# Patient Record
Sex: Female | Born: 1955 | Race: Black or African American | Hispanic: No | Smoking: Never smoker
Health system: Southern US, Community
[De-identification: ages and names within clinical notes are randomized; demographics above are authoritative.]

## PROBLEM LIST (undated history)

## (undated) DIAGNOSIS — I10 Essential (primary) hypertension: Secondary | ICD-10-CM

## (undated) HISTORY — DX: Essential (primary) hypertension: I10

---

## 1998-01-28 ENCOUNTER — Other Ambulatory Visit: Admission: RE | Admit: 1998-01-28 | Discharge: 1998-01-28 | Payer: Self-pay | Admitting: Obstetrics and Gynecology

## 1998-06-17 ENCOUNTER — Inpatient Hospital Stay (HOSPITAL_COMMUNITY): Admission: AD | Admit: 1998-06-17 | Discharge: 1998-06-17 | Payer: Self-pay | Admitting: Obstetrics and Gynecology

## 1998-08-31 ENCOUNTER — Inpatient Hospital Stay (HOSPITAL_COMMUNITY): Admission: AD | Admit: 1998-08-31 | Discharge: 1998-09-03 | Payer: Self-pay | Admitting: Obstetrics & Gynecology

## 1998-09-06 ENCOUNTER — Observation Stay (HOSPITAL_COMMUNITY): Admission: AD | Admit: 1998-09-06 | Discharge: 1998-09-07 | Payer: Self-pay | Admitting: Obstetrics & Gynecology

## 1998-10-03 ENCOUNTER — Other Ambulatory Visit: Admission: RE | Admit: 1998-10-03 | Discharge: 1998-10-03 | Payer: Self-pay | Admitting: Obstetrics & Gynecology

## 2000-02-24 ENCOUNTER — Other Ambulatory Visit: Admission: RE | Admit: 2000-02-24 | Discharge: 2000-02-24 | Payer: Self-pay | Admitting: Obstetrics & Gynecology

## 2001-07-18 ENCOUNTER — Other Ambulatory Visit: Admission: RE | Admit: 2001-07-18 | Discharge: 2001-07-18 | Payer: Self-pay | Admitting: Obstetrics & Gynecology

## 2003-01-15 ENCOUNTER — Other Ambulatory Visit: Admission: RE | Admit: 2003-01-15 | Discharge: 2003-01-15 | Payer: Self-pay | Admitting: Obstetrics and Gynecology

## 2003-11-04 ENCOUNTER — Emergency Department (HOSPITAL_COMMUNITY): Admission: AD | Admit: 2003-11-04 | Discharge: 2003-11-04 | Payer: Self-pay | Admitting: Family Medicine

## 2004-01-09 ENCOUNTER — Emergency Department (HOSPITAL_COMMUNITY): Admission: EM | Admit: 2004-01-09 | Discharge: 2004-01-09 | Payer: Self-pay | Admitting: *Deleted

## 2004-01-20 ENCOUNTER — Other Ambulatory Visit: Admission: RE | Admit: 2004-01-20 | Discharge: 2004-01-20 | Payer: Self-pay | Admitting: Obstetrics & Gynecology

## 2005-02-22 ENCOUNTER — Other Ambulatory Visit: Admission: RE | Admit: 2005-02-22 | Discharge: 2005-02-22 | Payer: Self-pay | Admitting: Obstetrics & Gynecology

## 2005-05-08 ENCOUNTER — Emergency Department (HOSPITAL_COMMUNITY): Admission: EM | Admit: 2005-05-08 | Discharge: 2005-05-08 | Payer: Self-pay | Admitting: Family Medicine

## 2007-09-28 ENCOUNTER — Emergency Department (HOSPITAL_COMMUNITY): Admission: EM | Admit: 2007-09-28 | Discharge: 2007-09-28 | Payer: Self-pay | Admitting: Family Medicine

## 2008-01-18 ENCOUNTER — Emergency Department (HOSPITAL_COMMUNITY): Admission: EM | Admit: 2008-01-18 | Discharge: 2008-01-18 | Payer: Self-pay | Admitting: Emergency Medicine

## 2011-03-28 ENCOUNTER — Emergency Department (HOSPITAL_COMMUNITY)
Admission: EM | Admit: 2011-03-28 | Discharge: 2011-03-28 | Disposition: A | Discharge status: Home / Self Care | Payer: BC Managed Care – PPO | Attending: Emergency Medicine | Admitting: Emergency Medicine

## 2011-03-28 ENCOUNTER — Emergency Department (HOSPITAL_COMMUNITY): Payer: BC Managed Care – PPO

## 2011-03-28 DIAGNOSIS — J9801 Acute bronchospasm: Secondary | ICD-10-CM | POA: Insufficient documentation

## 2011-03-28 DIAGNOSIS — R0602 Shortness of breath: Secondary | ICD-10-CM | POA: Insufficient documentation

## 2011-03-28 DIAGNOSIS — R062 Wheezing: Secondary | ICD-10-CM | POA: Insufficient documentation

## 2018-06-14 NOTE — Congregational Nurse Program (Signed)
  Dept: [phone]   Congregational Nurse Program Note  Date of Encounter: 06/14/2018  Past Medical History: No past medical history on file.  Encounter Details: CNP Questionnaire - 06/14/18 1927      Questionnaire   Patient Status  Not Applicable    Race  Black or African American    Location Patient Served At  [employer]  No food insecurities    Housing/Utilities  Yes, have permanent housing    Transportation  No transportation needs    Interpersonal Safety  Yes, feel physically and emotionally safe where you currently live    Medication  No medication insecurities    Medical Provider  Yes    Referrals  Primary Care Provider/Clinic    ED Visit Averted  Not Applicable    Life-Saving Intervention Made  Not Applicable      Encounter: Clients BP was up checked several times last finding was 180/94. She states she has never had headache and she did have one,  On last week. I encouraged her to call her primary and make an appointment. I went over signs of increased BP. She made an appointment and knows to call back  If  any problems further.[employer] Elery Cadenhead RN BSN [facility] CN  PhD. [phone]

## 2019-01-10 NOTE — Congregational Nurse Program (Signed)
  Dept: [phone]   Congregational Nurse Program Note  Date of Encounter: 01/10/2019  Past Medical History: No past medical history on file.  Encounter Details: CNP Questionnaire - 01/10/19 1600      Questionnaire   Patient Status  Not Applicable    Race  Black or African American    Location Patient Served At  [employer]  Not Applicable    Uninsured  Not Applicable    Food  No food insecurities    Housing/Utilities  Yes, have permanent housing    Transportation  No transportation needs    Interpersonal Safety  Yes, feel physically and emotionally safe where you currently live    Medication  No medication insecurities    Medical Provider  Yes    Referrals  Primary Care Provider/Clinic    ED Visit Averted  Not Applicable    Life-Saving Intervention Made  Not Applicable      Medical Encounter: Checked her BP for her.: [employer] RN BSN CN [facility] PhD. [phone]

## 2019-01-17 NOTE — Congregational Nurse Program (Signed)
  Dept: [phone]   Congregational Nurse Program Note  Date of Encounter: 01/17/2019  Past Medical History: No past medical history on file.  Encounter Details: CNP Questionnaire - 01/17/19 1823      Questionnaire   Patient Status  Not Applicable    Race  Black or African American    Location Patient Served At  [employer]  Not Applicable    Uninsured  Not Applicable    Food  No food insecurities    Housing/Utilities  Yes, have permanent housing    Transportation  No transportation needs    Interpersonal Safety  Yes, feel physically and emotionally safe where you currently live    Medication  No medication insecurities    Medical Provider  Yes    Referrals  Primary Care Provider/Clinic    ED Visit Averted  Not Applicable    Life-Saving Intervention Made  Not Applicable      Encounter : Follow up with client /her BP. [employer] Jamill Wetmore RN BSN CN BC [phone]

## 2019-02-08 ENCOUNTER — Other Ambulatory Visit: Payer: Self-pay | Admitting: *Deleted

## 2019-02-08 DIAGNOSIS — Z20822 Contact with and (suspected) exposure to covid-19: Secondary | ICD-10-CM

## 2019-02-11 LAB — NOVEL CORONAVIRUS, NAA: SARS-CoV-2, NAA: NOT DETECTED

## 2019-03-15 ENCOUNTER — Other Ambulatory Visit: Payer: Self-pay | Admitting: *Deleted

## 2019-03-15 DIAGNOSIS — Z20822 Contact with and (suspected) exposure to covid-19: Secondary | ICD-10-CM

## 2019-03-15 NOTE — Progress Notes (Signed)
[id number] 

## 2019-03-20 LAB — NOVEL CORONAVIRUS, NAA: SARS-CoV-2, NAA: NOT DETECTED

## 2019-10-24 ENCOUNTER — Other Ambulatory Visit: Payer: Self-pay | Admitting: *Deleted

## 2019-10-24 DIAGNOSIS — Z20822 Contact with and (suspected) exposure to covid-19: Secondary | ICD-10-CM

## 2019-10-25 LAB — NOVEL CORONAVIRUS, NAA: SARS-CoV-2, NAA: NOT DETECTED

## 2020-01-07 ENCOUNTER — Ambulatory Visit: Payer: BC Managed Care – PPO | Attending: Internal Medicine

## 2020-01-07 DIAGNOSIS — Z20822 Contact with and (suspected) exposure to covid-19: Secondary | ICD-10-CM | POA: Insufficient documentation

## 2020-01-08 LAB — NOVEL CORONAVIRUS, NAA: SARS-CoV-2, NAA: NOT DETECTED

## 2020-01-08 LAB — SARS-COV-2, NAA 2 DAY TAT

## 2020-01-14 ENCOUNTER — Other Ambulatory Visit: Payer: Self-pay | Admitting: Radiology

## 2020-01-14 DIAGNOSIS — Z20822 Contact with and (suspected) exposure to covid-19: Secondary | ICD-10-CM

## 2020-01-15 LAB — NOVEL CORONAVIRUS, NAA: SARS-CoV-2, NAA: NOT DETECTED

## 2020-01-15 LAB — SARS-COV-2, NAA 2 DAY TAT

## 2020-05-30 ENCOUNTER — Other Ambulatory Visit: Payer: Self-pay

## 2020-05-30 DIAGNOSIS — Z20822 Contact with and (suspected) exposure to covid-19: Secondary | ICD-10-CM

## 2020-05-31 LAB — NOVEL CORONAVIRUS, NAA: SARS-CoV-2, NAA: NOT DETECTED

## 2020-05-31 LAB — SARS-COV-2, NAA 2 DAY TAT

## 2020-07-23 ENCOUNTER — Other Ambulatory Visit: Payer: Self-pay | Admitting: Obstetrics and Gynecology

## 2020-07-23 DIAGNOSIS — R928 Other abnormal and inconclusive findings on diagnostic imaging of breast: Secondary | ICD-10-CM

## 2020-08-07 ENCOUNTER — Other Ambulatory Visit: Payer: Self-pay

## 2020-08-07 ENCOUNTER — Ambulatory Visit
Admission: RE | Admit: 2020-08-07 | Discharge: 2020-08-07 | Disposition: A | Discharge status: Home / Self Care | Payer: BC Managed Care – PPO | Source: Ambulatory Visit | Attending: Obstetrics and Gynecology | Admitting: Obstetrics and Gynecology

## 2020-08-07 ENCOUNTER — Ambulatory Visit: Payer: Self-pay

## 2020-08-07 DIAGNOSIS — R928 Other abnormal and inconclusive findings on diagnostic imaging of breast: Secondary | ICD-10-CM

## 2021-08-30 HISTORY — PX: DIAGNOSTIC MAMMOGRAM: HXRAD719

## 2022-06-13 IMAGING — MG MM DIGITAL DIAGNOSTIC UNILAT*R* W/ TOMO W/ CAD
4 series · 4 of 12 positions shown · non-contrast
Comparison: Previous exam(s).

CLINICAL DATA: Recall from screening mammography with
tomosynthesis, possible asymmetry in the retroareolar RIGHT breast
at far posterior depth visible only on the MLO images.

EXAM:
DIGITAL DIAGNOSTIC UNILATERAL RIGHT MAMMOGRAM WITH TOMO AND CAD

[R ML synth-2D]
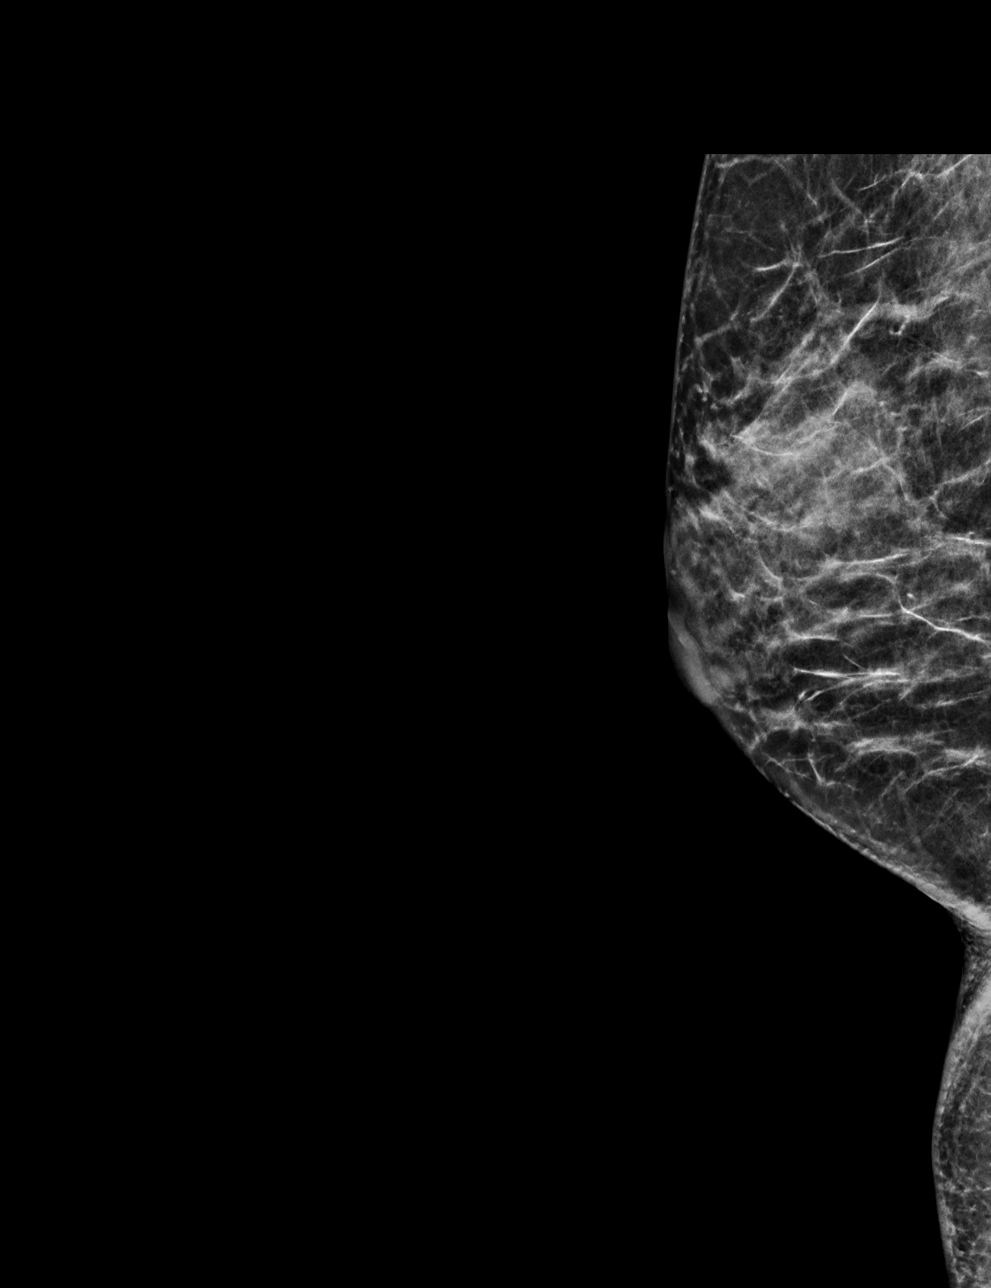

[R MLO synth-2D]
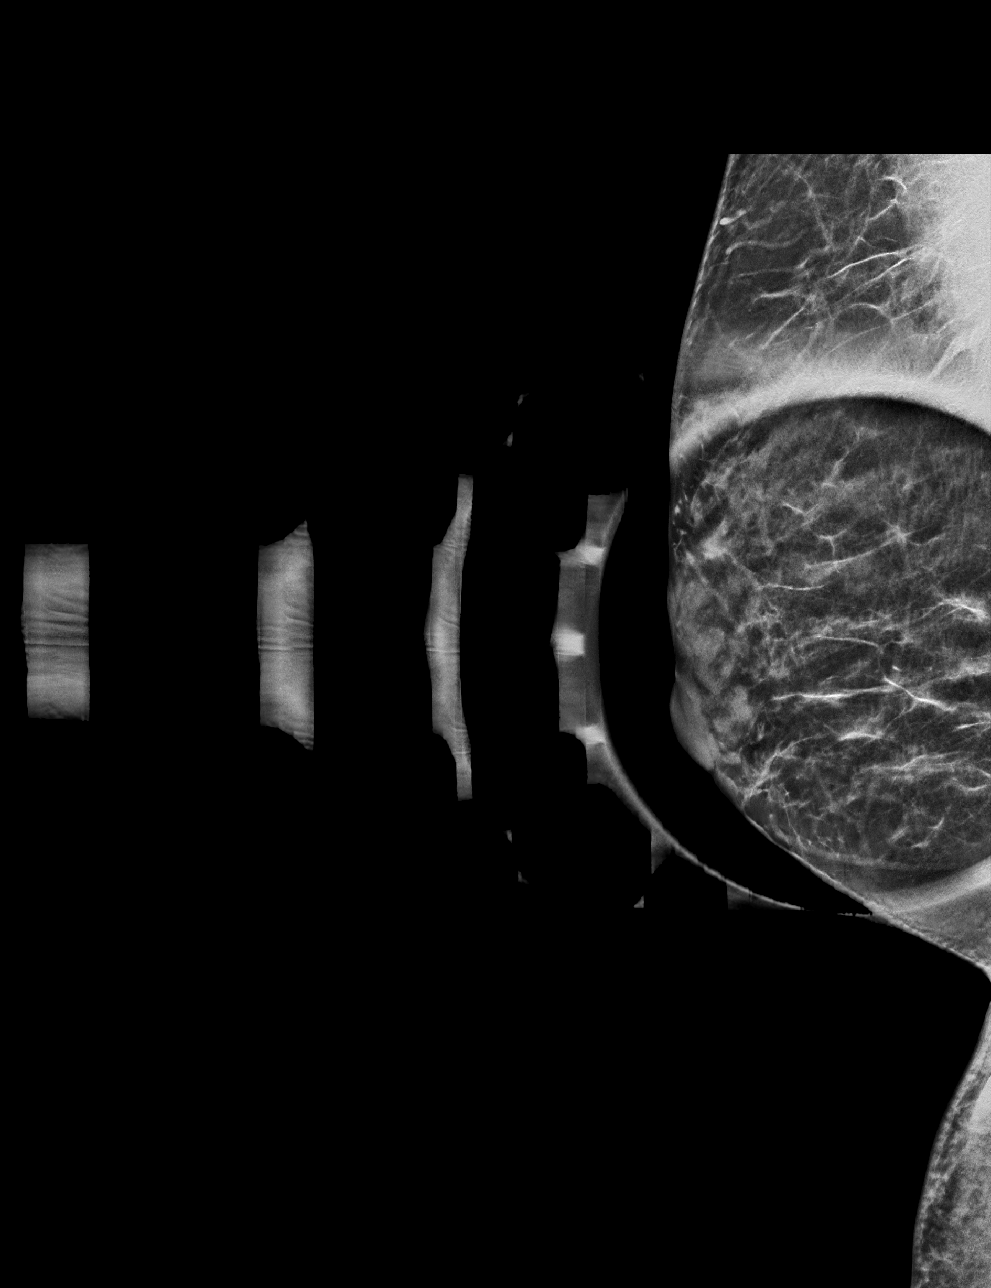

[R ML tomo · tomo slice 24/47.0]
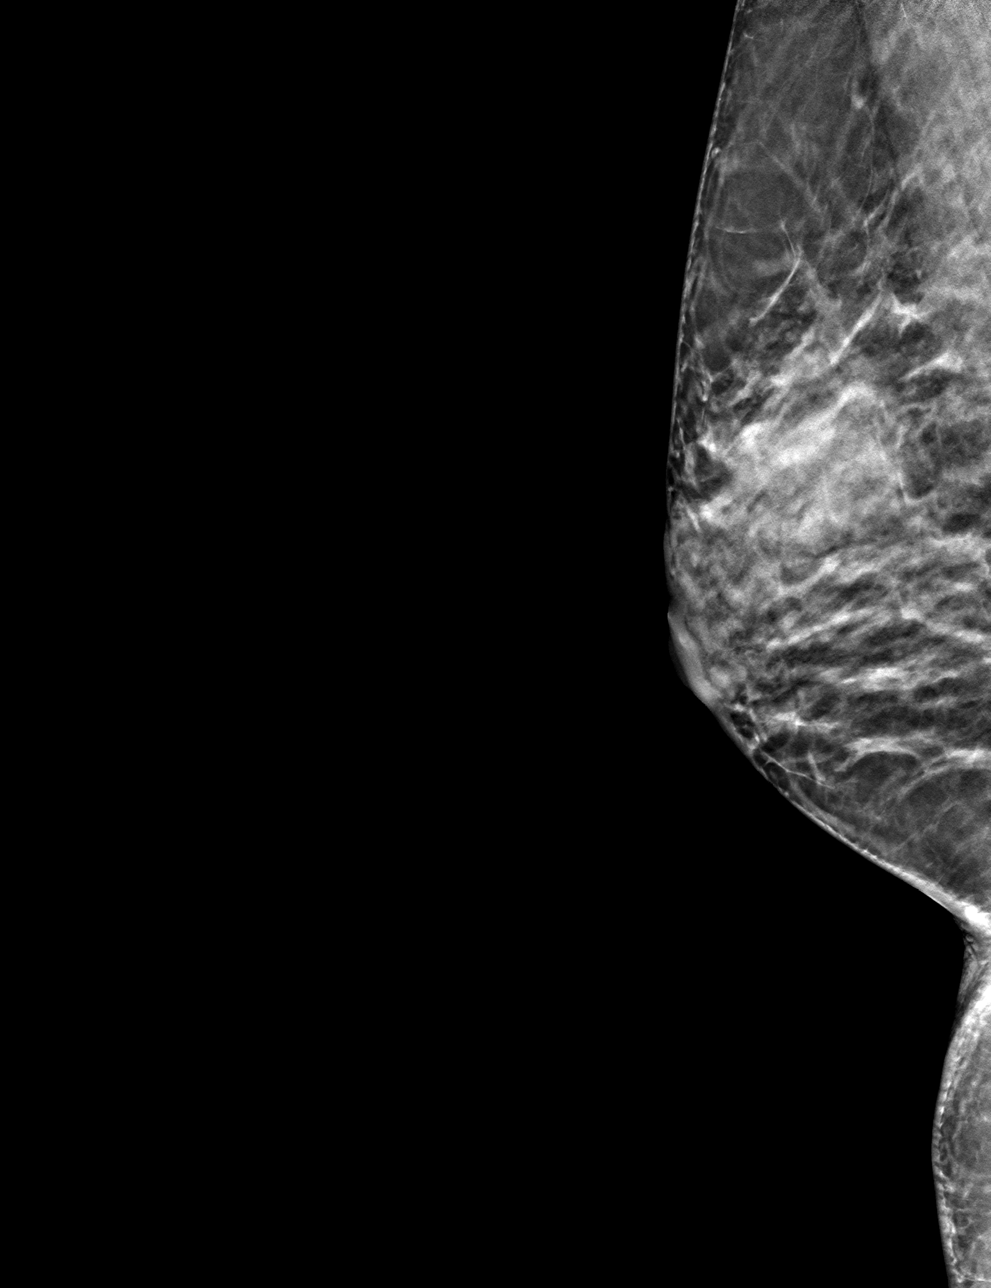

[R MLO tomo · tomo slice 25/49.0]
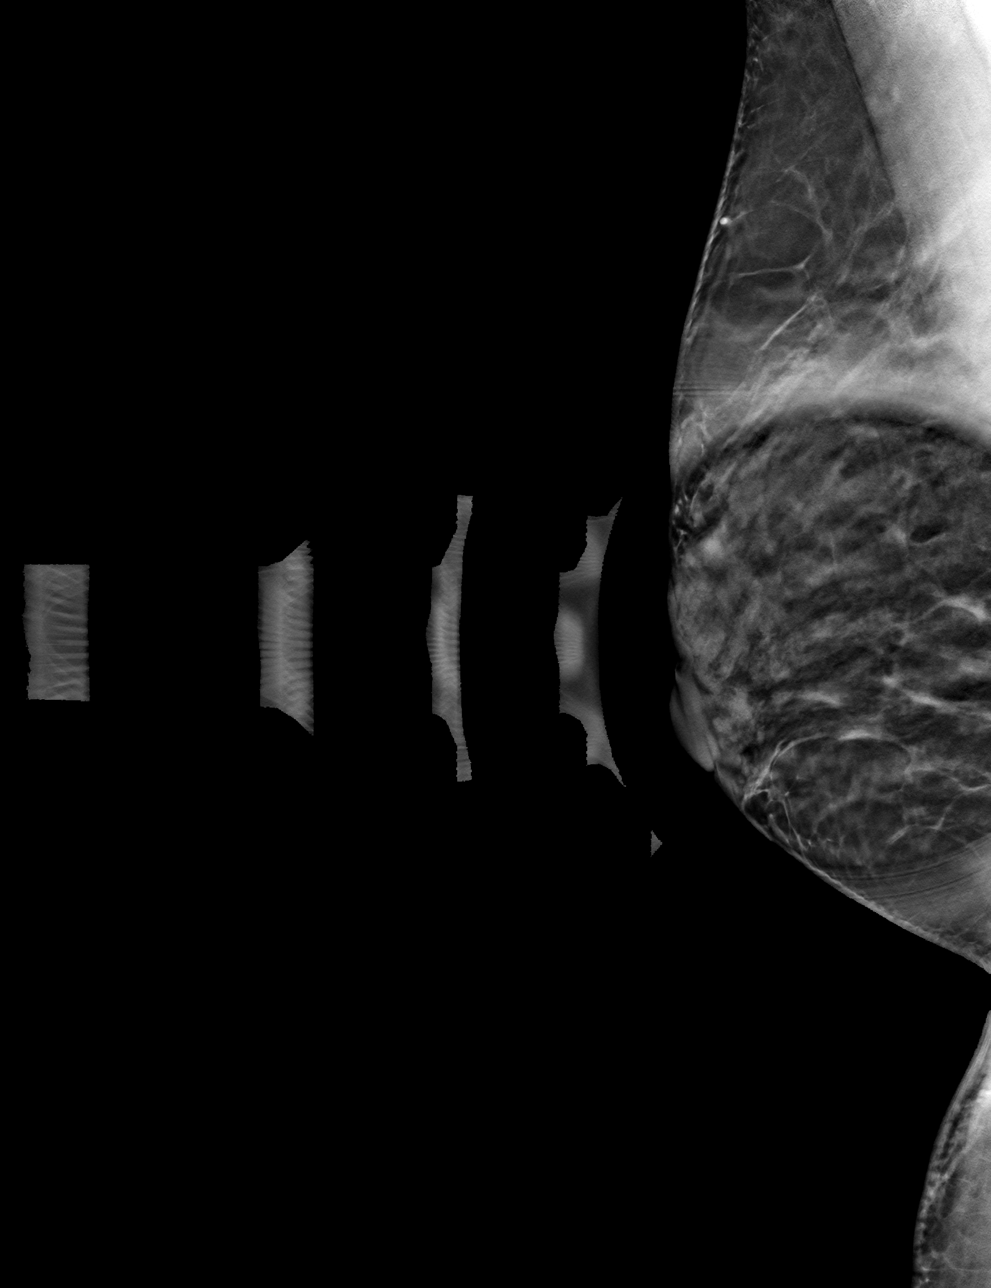

[4 of 12 positions shown; findings below may reference images not displayed]

ACR Breast Density Category c: The breast tissue is heterogeneously
dense, which may obscure small masses.
FINDINGS: Tomosynthesis and synthesized digital 2D spot compression MLO view
of the area of concern in the RIGHT breast and a tomosynthesis and
synthesized digital 2D full field mediolateral view of the RIGHT
breast were obtained. The full field mediolateral image was
processed with CAD.

The asymmetry in the retroareolar location at far posterior depth
questioned on screening mammography disperses with compression
indicating an island of normal fibroglandular tissue. There is no
underlying mass or architectural distortion.

No findings suspicious for malignancy in the RIGHT breast.
IMPRESSION: No mammographic evidence of malignancy involving the RIGHT breast.

RECOMMENDATION:
Screening mammogram in one year.(Code:[id number])

I have discussed the findings and recommendations with the patient.
If applicable, a reminder letter will be sent to the patient
regarding the next appointment.

BI-RADS CATEGORY  1: Negative.

## 2023-05-23 ENCOUNTER — Ambulatory Visit (INDEPENDENT_AMBULATORY_CARE_PROVIDER_SITE_OTHER): Payer: No Typology Code available for payment source | Admitting: Adult Health

## 2023-05-23 ENCOUNTER — Encounter: Payer: Self-pay | Admitting: Adult Health

## 2023-05-23 VITALS — BP 127/88 | HR 67 | Temp 96.9°F | Resp 18 | Ht 60.0 in | Wt 153.2 lb

## 2023-05-23 DIAGNOSIS — Z13820 Encounter for screening for osteoporosis: Secondary | ICD-10-CM

## 2023-05-23 DIAGNOSIS — I10 Essential (primary) hypertension: Secondary | ICD-10-CM | POA: Diagnosis not present

## 2023-05-23 DIAGNOSIS — Z7689 Persons encountering health services in other specified circumstances: Secondary | ICD-10-CM

## 2023-05-23 DIAGNOSIS — Z23 Encounter for immunization: Secondary | ICD-10-CM

## 2023-05-23 DIAGNOSIS — Z0000 Encounter for general adult medical examination without abnormal findings: Secondary | ICD-10-CM | POA: Diagnosis not present

## 2023-05-23 MED ORDER — LOSARTAN POTASSIUM 100 MG PO TABS: 100.0000 mg | ORAL_TABLET | Freq: Every day | ORAL | 3 refills | Status: DC

## 2023-05-23 NOTE — Patient Instructions (Signed)
Preventive Care 40-64 Years Old, Female Preventive care refers to lifestyle choices and visits with your health care provider that can promote health and wellness. Preventive care visits are also called wellness exams. What can I expect for my preventive care visit? Counseling Your health care provider may ask you questions about your: Medical history, including: Past medical problems. Family medical history. Pregnancy history. Current health, including: Menstrual cycle. Method of birth control. Emotional well-being. Home life and relationship well-being. Sexual activity and sexual health. Lifestyle, including: Alcohol, nicotine or tobacco, and drug use. Access to firearms. Diet, exercise, and sleep habits. Work and work environment. Sunscreen use. Safety issues such as seatbelt and bike helmet use. Physical exam Your health care provider will check your: Height and weight. These may be used to calculate your BMI (body mass index). BMI is a measurement that tells if you are at a healthy weight. Waist circumference. This measures the distance around your waistline. This measurement also tells if you are at a healthy weight and may help predict your risk of certain diseases, such as type 2 diabetes and high blood pressure. Heart rate and blood pressure. Body temperature. Skin for abnormal spots. What immunizations do I need?  Vaccines are usually given at various ages, according to a schedule. Your health care provider will recommend vaccines for you based on your age, medical history, and lifestyle or other factors, such as travel or where you work. What tests do I need? Screening Your health care provider may recommend screening tests for certain conditions. This may include: Lipid and cholesterol levels. Diabetes screening. This is done by checking your blood sugar (glucose) after you have not eaten for a while (fasting). Pelvic exam and Pap test. Hepatitis B test. Hepatitis C  test. HIV (human immunodeficiency virus) test. STI (sexually transmitted infection) testing, if you are at risk. Lung cancer screening. Colorectal cancer screening. Mammogram. Talk with your health care provider about when you should start having regular mammograms. This may depend on whether you have a family history of breast cancer. BRCA-related cancer screening. This may be done if you have a family history of breast, ovarian, tubal, or peritoneal cancers. Bone density scan. This is done to screen for osteoporosis. Talk with your health care provider about your test results, treatment options, and if necessary, the need for more tests. Follow these instructions at home: Eating and drinking  Eat a diet that includes fresh fruits and vegetables, whole grains, lean protein, and low-fat dairy products. Take vitamin and mineral supplements as recommended by your health care provider. Do not drink alcohol if: Your health care provider tells you not to drink. You are pregnant, may be pregnant, or are planning to become pregnant. If you drink alcohol: Limit how much you have to 0-1 drink a day. Know how much alcohol is in your drink. In the [place] one drink equals one 12 oz bottle of beer (355 mL), one 5 oz glass of wine (148 mL), or one 1 oz glass of hard liquor (44 mL). Lifestyle Brush your teeth every morning and night with fluoride toothpaste. Floss one time each day. Exercise for at least 30 minutes 5 or more days each week. Do not use any products that contain nicotine or tobacco. These products include cigarettes, chewing tobacco, and vaping devices, such as e-cigarettes. If you need help quitting, ask your health care provider. Do not use drugs. If you are sexually active, practice safe sex. Use a condom or other form of protection to   prevent STIs. If you do not wish to become pregnant, use a form of birth control. If you plan to become pregnant, see your health care provider for a  prepregnancy visit. Take aspirin only as told by your health care provider. Make sure that you understand how much to take and what form to take. Work with your health care provider to find out whether it is safe and beneficial for you to take aspirin daily. Find healthy ways to manage stress, such as: Meditation, yoga, or listening to music. Journaling. Talking to a trusted person. Spending time with friends and family. Minimize exposure to UV radiation to reduce your risk of skin cancer. Safety Always wear your seat belt while driving or riding in a vehicle. Do not drive: If you have been drinking alcohol. Do not ride with someone who has been drinking. When you are tired or distracted. While texting. If you have been using any mind-altering substances or drugs. Wear a helmet and other protective equipment during sports activities. If you have firearms in your house, make sure you follow all gun safety procedures. Seek help if you have been physically or sexually abused. What's next? Visit your health care provider once a year for an annual wellness visit. Ask your health care provider how often you should have your eyes and teeth checked. Stay up to date on all vaccines. This information is not intended to replace advice given to you by your health care provider. Make sure you discuss any questions you have with your health care provider. Document Revised: 02/11/2021 Document Reviewed: 02/11/2021 Elsevier Patient Education  2024 Elsevier Inc.  

## 2023-05-23 NOTE — Progress Notes (Signed)
[facility] clinic  Provider: Kenard Gower DNP  Code Status:  Full Code  Goals of Care:     05/23/2023    4:52 PM  Advanced Directives  Does Patient Have a Medical Advance Directive? No  Would patient like information on creating a medical advance directive? No - Patient declined     Chief Complaint  Patient presents with   Establish Care     establish new primary care    HPI: Patient is a 67 y.o. female seen today to establish care with [facility]. She is accompanied today by her mother, who is also a patient at [facility]. She and her mother lives together. She works as Scientist, product/process development at [employer]. She has a Energy manager degree in Dance minor in [employer]. She has some graduate courses. She is a widow and has a daughter. She does not smoke nor drink alcohol. Singing is her hobby. She denies exercising and stated that her diet is "not good". She follows up at [facility] (last appointment March 2024).    Past Medical History:  Diagnosis Date   Hypertension     Past Surgical History:  Procedure Laterality Date   CESAREAN SECTION  2000   DIAGNOSTIC MAMMOGRAM  2023    No Known Allergies  Outpatient Encounter Medications as of 05/23/2023  Medication Sig   [DISCONTINUED] losartan (COZAAR) 100 MG tablet Take 100 mg by mouth daily.   losartan (COZAAR) 100 MG tablet Take 1 tablet (100 mg total) by mouth daily.   [DISCONTINUED] losartan (COZAAR) 100 MG tablet Take 1 tablet (100 mg total) by mouth daily.   No facility-administered encounter medications on file as of 05/23/2023.    Review of Systems:  Review of Systems  Constitutional:  Negative for appetite change, chills, fatigue and fever.  HENT:  Negative for congestion, hearing loss, rhinorrhea and sore throat.   Eyes: Negative.   Respiratory:  Negative for cough, shortness of breath and wheezing.   Cardiovascular:  Negative for chest pain, palpitations and leg swelling.   Gastrointestinal:  Negative for abdominal pain, constipation, diarrhea, nausea and vomiting.  Genitourinary:  Negative for dysuria.  Musculoskeletal:  Negative for arthralgias, back pain and myalgias.  Skin:  Negative for color change, rash and wound.  Neurological:  Negative for dizziness, weakness and headaches.  Psychiatric/Behavioral:  Negative for behavioral problems. The patient is not nervous/anxious.     Health Maintenance  Topic Date Due   HIV Screening  Never done   Hepatitis C Screening  Never done   DTaP/Tdap/Td (1 - Tdap) Never done   Cervical Cancer Screening (HPV/Pap Cotest)  Never done   Zoster Vaccines- Shingrix (1 of 2) Never done   Pneumonia Vaccine 28+ Years old (1 of 1 - PCV) Never done   DEXA SCAN  Never done   COVID-19 Vaccine (1 - 2023-24 season) Never done   MAMMOGRAM  05/01/2025   Colonoscopy  02/03/2031   INFLUENZA VACCINE  Completed   HPV VACCINES  Aged Out    Physical Exam: Vitals:   05/23/23 1539  BP: 127/88  Pulse: 67  Resp: 18  Temp: (!) 96.9 F (36.1 C)  SpO2: 98%  Weight: 153 lb 3.2 oz (69.5 kg)  Height: 5' (1.524 m)   Body mass index is 29.92 kg/m. Physical Exam Constitutional:      General: She is not in acute distress.    Appearance: Normal appearance.  HENT:  Head: Normocephalic and atraumatic.     Nose: Nose normal.     Mouth/Throat:     Mouth: Mucous membranes are moist.  Eyes:     Conjunctiva/sclera: Conjunctivae normal.  Cardiovascular:     Rate and Rhythm: Normal rate and regular rhythm.  Pulmonary:     Effort: Pulmonary effort is normal.     Breath sounds: Normal breath sounds.  Abdominal:     General: Bowel sounds are normal.     Palpations: Abdomen is soft.  Musculoskeletal:        General: Normal range of motion.     Cervical back: Normal range of motion.  Skin:    General: Skin is warm and dry.  Neurological:     General: No focal deficit present.     Mental Status: She is alert and oriented to  person, place, and time.  Psychiatric:        Mood and Affect: Mood normal.        Behavior: Behavior normal.        Thought Content: Thought content normal.        Judgment: Judgment normal.     Labs reviewed: Basic Metabolic Panel: Recent Labs    05/26/23 0917  NA 141  K 3.9  CL 105  CO2 30  GLUCOSE 85  BUN 14  CREATININE 1.01  CALCIUM 9.4   Liver Function Tests: Recent Labs    05/26/23 0917  AST 13  ALT 16  BILITOT 0.8  PROT 6.9   No results for input(s): "LIPASE", "AMYLASE" in the last 8760 hours. No results for input(s): "AMMONIA" in the last 8760 hours. CBC: Recent Labs    05/26/23 0917  WBC 4.1  NEUTROABS 2,140  HGB 13.4  HCT 41.1  MCV 91.1  PLT 287   Lipid Panel: Recent Labs    05/26/23 0917  CHOL 194  HDL 60  LDLCALC 115*  TRIG 87  CHOLHDL 3.2   Lab Results  Component Value Date   HGBA1C 5.5 05/26/2023    Procedures since last visit: No results found.  Assessment/Plan  1. Encounter to establish care -  established care with [facility]  2. Primary hypertension -  BP 127/88, stable -  continue Losartan - CBC with Differential/Platelets; Future - Lipid panel; Future - Complete Metabolic Panel with eGFR; Future - Hemoglobin A1C; Future  3. Screening for osteoporosis - DG BONE DENSITY (DXA); Future  4. Encounter for preventive care - Hemoglobin A1C; Future  5. Need for influenza vaccination - Flu Vaccine Trivalent High Dose (Fluad)   Labs/tests ordered:  - CBC with Differential/Platelets; Future - Lipid panel; Future - Complete Metabolic Panel with eGFR; Future - Hemoglobin A1C; Future - DG BONE DENSITY (DXA); Future    Next appt:  Visit date not found

## 2023-05-25 ENCOUNTER — Other Ambulatory Visit: Payer: No Typology Code available for payment source

## 2023-05-26 ENCOUNTER — Other Ambulatory Visit: Payer: No Typology Code available for payment source

## 2023-05-26 DIAGNOSIS — I10 Essential (primary) hypertension: Secondary | ICD-10-CM

## 2023-05-26 DIAGNOSIS — Z0000 Encounter for general adult medical examination without abnormal findings: Secondary | ICD-10-CM

## 2023-05-26 LAB — CBC WITH DIFFERENTIAL/PLATELET
Absolute Monocytes: 230 cells/uL (ref 200–950)
Basophils Absolute: 21 cells/uL (ref 0–200)
Basophils Relative: 0.5 %
Eosinophils Absolute: 180 cells/uL (ref 15–500)
Eosinophils Relative: 4.4 %
HCT: 41.1 % (ref 35.0–45.0)
Hemoglobin: 13.4 g/dL (ref 11.7–15.5)
Lymphs Abs: 1529 cells/uL (ref 850–3900)
MCH: 29.7 pg (ref 27.0–33.0)
MCHC: 32.6 g/dL (ref 32.0–36.0)
MCV: 91.1 fL (ref 80.0–100.0)
MPV: 9.5 fL (ref 7.5–12.5)
Monocytes Relative: 5.6 %
Neutro Abs: 2140 cells/uL (ref 1500–7800)
Neutrophils Relative %: 52.2 %
Platelets: 287 10*3/uL (ref 140–400)
RBC: 4.51 10*6/uL (ref 3.80–5.10)
RDW: 14 % (ref 11.0–15.0)
Total Lymphocyte: 37.3 %
WBC: 4.1 10*3/uL (ref 3.8–10.8)

## 2023-05-27 LAB — LIPID PANEL
Cholesterol: 194 mg/dL (ref <200)
HDL: 60 mg/dL (ref >=50)
LDL Cholesterol (Calc): 115 mg/dL — ABNORMAL HIGH
Non-HDL Cholesterol (Calc): 134 mg/dL — ABNORMAL HIGH (ref <130)
Total CHOL/HDL Ratio: 3.2 (calc) (ref <5.0)
Triglycerides: 87 mg/dL (ref <150)

## 2023-05-27 LAB — HEMOGLOBIN A1C
Hgb A1c MFr Bld: 5.5 %{Hb} (ref <5.7)
Mean Plasma Glucose: 111 mg/dL
eAG (mmol/L): 6.2 mmol/L

## 2023-05-27 LAB — COMPLETE METABOLIC PANEL WITH GFR
AG Ratio: 1.6 (calc) (ref 1.0–2.5)
ALT: 16 U/L (ref 6–29)
AST: 13 U/L (ref 10–35)
Albumin: 4.2 g/dL (ref 3.6–5.1)
Alkaline phosphatase (APISO): 68 U/L (ref 37–153)
BUN: 14 mg/dL (ref 7–25)
CO2: 30 mmol/L (ref 20–32)
Calcium: 9.4 mg/dL (ref 8.6–10.4)
Chloride: 105 mmol/L (ref 98–110)
Creat: 1.01 mg/dL (ref 0.50–1.05)
Globulin: 2.7 g/dL (ref 1.9–3.7)
Glucose, Bld: 85 mg/dL (ref 65–99)
Potassium: 3.9 mmol/L (ref 3.5–5.3)
Sodium: 141 mmol/L (ref 135–146)
Total Bilirubin: 0.8 mg/dL (ref 0.2–1.2)
Total Protein: 6.9 g/dL (ref 6.1–8.1)
eGFR: 62 mL/min/{1.73_m2} (ref >=60)

## 2023-05-29 NOTE — Progress Notes (Signed)
-    LDL 115, elevated (normal <161), cholesterol and triglycerides normal, will need to exercise 150 minutes/week and low fat diet, increase vegetable intake -  CMP, CBC and A1C normal

## 2023-06-17 ENCOUNTER — Ambulatory Visit: Payer: No Typology Code available for payment source | Admitting: Adult Health

## 2023-06-20 ENCOUNTER — Ambulatory Visit: Payer: No Typology Code available for payment source | Admitting: Adult Health

## 2023-12-09 ENCOUNTER — Ambulatory Visit
Admission: RE | Admit: 2023-12-09 | Discharge: 2023-12-09 | Disposition: A | Discharge status: Home / Self Care | Payer: Self-pay | Source: Ambulatory Visit | Attending: Adult Health | Admitting: Adult Health

## 2023-12-09 DIAGNOSIS — Z13820 Encounter for screening for osteoporosis: Secondary | ICD-10-CM

## 2023-12-13 NOTE — Progress Notes (Signed)
-    Bone density showed osteopenia -  will need to start on Vitamin D 2,000 units daily and Calcium 500 mg BID supplementation

## 2024-01-09 ENCOUNTER — Ambulatory Visit: Payer: No Typology Code available for payment source | Admitting: Adult Health

## 2024-06-25 ENCOUNTER — Other Ambulatory Visit: Payer: Self-pay | Admitting: Adult Health

## 2024-06-25 NOTE — Telephone Encounter (Signed)
 Patent hasn't been seen within a year. Tried contacting patient regarding setting up a routine visit. Unable to get in touch with patient. Unable to leave voicemail due to no voicemail box set up.

## 2024-08-06 ENCOUNTER — Telehealth: Payer: Self-pay | Admitting: Adult Health

## 2024-08-06 NOTE — Telephone Encounter (Unsigned)
 Copied from CRM [id number]. Topic: Clinical - Medication Refill >> Aug 06, 2024  1:46 PM Suzette B wrote: Medication: losartan  (COZAAR ) 100 MG tablet  Has the patient contacted their pharmacy? No  This is the patient's preferred pharmacy:    [id number] [address], [place] - [address] [place] [address] Phone: [phone] Fax: [phone]  Is this the correct pharmacy for this prescription? Yes If no, delete pharmacy and type the correct one.   Has the prescription been filled recently? Yes  Is the patient out of the medication? No  Has the patient been seen for an appointment in the last year OR does the patient have an upcoming appointment? Yes  Can we respond through MyChart? Yes  Agent: Please be advised that Rx refills may take up to 3 business days. We ask that you follow-up with your pharmacy.

## 2024-08-29 ENCOUNTER — Other Ambulatory Visit: Payer: Self-pay | Admitting: Adult Health

## 2024-10-01 ENCOUNTER — Other Ambulatory Visit: Payer: Self-pay | Admitting: Adult Health
